# Patient Record
Sex: Male | Born: 1965 | Race: Black or African American | Hispanic: No | Marital: Single | Smoking: Never smoker
Health system: Southern US, Community
[De-identification: ages and names within clinical notes are randomized; demographics above are authoritative.]

---

## 1997-12-10 ENCOUNTER — Encounter: Admission: RE | Admit: 1997-12-10 | Discharge: 1997-12-10 | Payer: Self-pay | Admitting: Family Medicine

## 1998-01-23 ENCOUNTER — Encounter: Admission: RE | Admit: 1998-01-23 | Discharge: 1998-01-23 | Payer: Self-pay | Admitting: Family Medicine

## 2000-09-16 ENCOUNTER — Emergency Department (HOSPITAL_COMMUNITY): Admission: EM | Admit: 2000-09-16 | Discharge: 2000-09-16 | Payer: Self-pay | Admitting: Emergency Medicine

## 2000-09-16 ENCOUNTER — Encounter: Payer: Self-pay | Admitting: Emergency Medicine

## 2000-10-27 ENCOUNTER — Encounter: Payer: Self-pay | Admitting: Emergency Medicine

## 2000-10-27 ENCOUNTER — Emergency Department (HOSPITAL_COMMUNITY): Admission: EM | Admit: 2000-10-27 | Discharge: 2000-10-27 | Payer: Self-pay | Admitting: Emergency Medicine

## 2002-12-23 ENCOUNTER — Encounter: Payer: Self-pay | Admitting: Emergency Medicine

## 2002-12-23 ENCOUNTER — Emergency Department (HOSPITAL_COMMUNITY): Admission: EM | Admit: 2002-12-23 | Discharge: 2002-12-23 | Payer: Self-pay

## 2006-05-20 ENCOUNTER — Emergency Department (HOSPITAL_COMMUNITY): Admission: EM | Admit: 2006-05-20 | Discharge: 2006-05-20 | Payer: Self-pay | Admitting: Emergency Medicine

## 2011-11-12 ENCOUNTER — Ambulatory Visit: Payer: Self-pay | Admitting: Emergency Medicine

## 2011-11-12 VITALS — BP 128/80 | HR 61 | Temp 98.2°F | Resp 16 | Ht 68.5 in | Wt 170.2 lb

## 2011-11-12 DIAGNOSIS — Z0000 Encounter for general adult medical examination without abnormal findings: Secondary | ICD-10-CM

## 2011-11-12 NOTE — Progress Notes (Signed)
  Subjective:    Patient ID: Benjamin Meyers, male    DOB: [birth year], 46 y.o.   MRN: [id number]  HPI No complaints.  For physical   Review of Systems No significant medical history    Objective:   Physical Exam  Normal exam documented on form      Assessment & Plan:  Fit

## 2012-05-15 ENCOUNTER — Encounter (HOSPITAL_COMMUNITY): Payer: Self-pay | Admitting: Emergency Medicine

## 2012-05-15 ENCOUNTER — Emergency Department (HOSPITAL_COMMUNITY)
Admission: EM | Admit: 2012-05-15 | Discharge: 2012-05-15 | Disposition: A | Discharge status: Home / Self Care | Payer: Commercial Managed Care - PPO | Attending: Emergency Medicine | Admitting: Emergency Medicine

## 2012-05-15 DIAGNOSIS — R109 Unspecified abdominal pain: Secondary | ICD-10-CM | POA: Insufficient documentation

## 2012-05-15 LAB — COMPREHENSIVE METABOLIC PANEL
AST: 23 U/L (ref 0–37)
Albumin: 4.1 g/dL (ref 3.5–5.2)
Alkaline Phosphatase: 72 U/L (ref 39–117)
Chloride: 101 mEq/L (ref 96–112)
Creatinine, Ser: 1.35 mg/dL (ref 0.50–1.35)
Potassium: 4.4 mEq/L (ref 3.5–5.1)
Total Bilirubin: 0.4 mg/dL (ref 0.3–1.2)
Total Protein: 7.5 g/dL (ref 6.0–8.3)

## 2012-05-15 LAB — CBC WITH DIFFERENTIAL/PLATELET
Basophils Absolute: 0 10*3/uL (ref 0.0–0.1)
Basophils Relative: 0 % (ref 0–1)
MCHC: 33.7 g/dL (ref 30.0–36.0)
Neutro Abs: 4.8 10*3/uL (ref 1.7–7.7)
Neutrophils Relative %: 62 % (ref 43–77)
Platelets: 198 10*3/uL (ref 150–400)
RDW: 13.2 % (ref 11.5–15.5)

## 2012-05-15 LAB — URINALYSIS, MICROSCOPIC ONLY
Glucose, UA: NEGATIVE mg/dL
Ketones, ur: NEGATIVE mg/dL
Leukocytes, UA: NEGATIVE
Specific Gravity, Urine: 1.028 (ref 1.005–1.030)
pH: 6 (ref 5.0–8.0)

## 2012-05-15 NOTE — ED Notes (Signed)
Pt presenting to ed with c/o lower abdominal pain x 6 months. Pt states pain is worse when he sneezes. Pt states he thought it was a pulled muscle but it's not getting any better.

## 2012-05-15 NOTE — ED Provider Notes (Signed)
History     CSN: [id number]  Arrival date & time 05/15/12  1453   First MD Initiated Contact with Patient 05/15/12 1834      Chief Complaint  Patient presents with  . Abdominal Pain    (Consider location/radiation/quality/duration/timing/severity/associated sxs/prior treatment) Patient is a 47 y.o. male presenting with abdominal pain. The history is provided by the patient.  Abdominal Pain The primary symptoms of the illness include abdominal pain.   patient here with abdominal pain x6 months at his left mid abdomen. No fever or chills. No urinary symptoms. No vomiting or diarrhea. Pain is characterized as sharp and worse with certain movements. Denies any testicular pain. No hematuria. Pain is better with remaining still.  History reviewed. No pertinent past medical history.  History reviewed. No pertinent past surgical history.  No family history on file.  History  Substance Use Topics  . Smoking status: Never Smoker   . Smokeless tobacco: Not on file  . Alcohol Use: No      Review of Systems  Gastrointestinal: Positive for abdominal pain.  All other systems reviewed and are negative.    Allergies  Penicillins  Home Medications  No current outpatient prescriptions on file.  BP 135/72  Pulse 67  Temp 98.2 F (36.8 C) (Oral)  Resp 20  SpO2 100%  Physical Exam  Nursing note and vitals reviewed. Constitutional: He is oriented to person, place, and time. He appears well-developed and well-nourished.  Non-toxic appearance. No distress.  HENT:  Head: Normocephalic and atraumatic.  Eyes: Conjunctivae normal, EOM and lids are normal. Pupils are equal, round, and reactive to light.  Neck: Normal range of motion. Neck supple. No tracheal deviation present. No mass present.  Cardiovascular: Normal rate, regular rhythm and normal heart sounds.  Exam reveals no gallop.   No murmur heard. Pulmonary/Chest: Effort normal and breath sounds normal. No stridor. No  respiratory distress. He has no decreased breath sounds. He has no wheezes. He has no rhonchi. He has no rales.  Abdominal: Soft. Normal appearance and bowel sounds are normal. He exhibits no distension. There is no tenderness. There is no rigidity, no rebound, no guarding and no CVA tenderness.    Musculoskeletal: Normal range of motion. He exhibits no edema and no tenderness.  Neurological: He is alert and oriented to person, place, and time. He has normal strength. No cranial nerve deficit or sensory deficit. GCS eye subscore is 4. GCS verbal subscore is 5. GCS motor subscore is 6.  Skin: Skin is warm and dry. No abrasion and no rash noted.  Psychiatric: He has a normal mood and affect. His speech is normal and behavior is normal.    ED Course  Procedures (including critical care time)  Labs Reviewed  COMPREHENSIVE METABOLIC PANEL - Abnormal; Notable for the following:    Glucose, Bld 110 (*)     GFR calc non Af Amer 61 (*)     GFR calc Af Amer 71 (*)     All other components within normal limits  CBC WITH DIFFERENTIAL  LIPASE, BLOOD  URINALYSIS, MICROSCOPIC ONLY   No results found.   No diagnosis found.    MDM  Patient with normal labs and nonsurgical abdomen. Patient with rectus abdominal strain and is stable for discharge        Toy Baker, MD 05/15/12 [id number]

## 2013-02-27 ENCOUNTER — Ambulatory Visit (INDEPENDENT_AMBULATORY_CARE_PROVIDER_SITE_OTHER): Payer: BC Managed Care – PPO | Admitting: Emergency Medicine

## 2013-02-27 VITALS — BP 110/72 | HR 72 | Temp 98.3°F | Resp 16 | Ht 68.0 in | Wt 177.2 lb

## 2013-02-27 DIAGNOSIS — Z23 Encounter for immunization: Secondary | ICD-10-CM

## 2013-02-27 DIAGNOSIS — Z0000 Encounter for general adult medical examination without abnormal findings: Secondary | ICD-10-CM

## 2013-02-27 LAB — LIPID PANEL
Cholesterol: 184 mg/dL (ref 0–200)
Total CHOL/HDL Ratio: 3.5 Ratio
Triglycerides: 164 mg/dL — ABNORMAL HIGH (ref <150)
VLDL: 33 mg/dL (ref 0–40)

## 2013-02-27 LAB — POCT CBC
HCT, POC: 46.2 % (ref 43.5–53.7)
Hemoglobin: 14.4 g/dL (ref 14.1–18.1)
Lymph, poc: 2.4 (ref 0.6–3.4)
MCH, POC: 27.6 pg (ref 27–31.2)
MCHC: 31.2 g/dL — AB (ref 31.8–35.4)
MCV: 88.5 fL (ref 80–97)
POC Granulocyte: 2.3 (ref 2–6.9)
POC LYMPH PERCENT: 47.7 %L (ref 10–50)
RDW, POC: 15 %
WBC: 5.1 10*3/uL (ref 4.6–10.2)

## 2013-02-27 LAB — COMPREHENSIVE METABOLIC PANEL
BUN: 19 mg/dL (ref 6–23)
CO2: 29 mEq/L (ref 19–32)
Calcium: 10 mg/dL (ref 8.4–10.5)
Chloride: 106 mEq/L (ref 96–112)
Creat: 1.67 mg/dL — ABNORMAL HIGH (ref 0.50–1.35)
Glucose, Bld: 104 mg/dL — ABNORMAL HIGH (ref 70–99)
Total Bilirubin: 0.6 mg/dL (ref 0.3–1.2)

## 2013-02-27 LAB — POCT URINALYSIS DIPSTICK
Bilirubin, UA: NEGATIVE
Blood, UA: NEGATIVE
Ketones, UA: NEGATIVE
Protein, UA: NEGATIVE
Spec Grav, UA: 1.015
pH, UA: 7

## 2013-02-27 NOTE — Progress Notes (Signed)
[facility] [address] [place] [address] [phone]  Date:  02/27/2013   Name:  Benjamin Meyers   DOB:  June 28, 1965   MRN:  [id number]  PCP:  No primary provider on file.    Chief Complaint: Annual Exam   History of Present Illness:  Benjamin Meyers is a 47 y.o. very pleasant male patient who presents with the following:  For wellness physical.  No medications.  No past medical history of consequence.  Truck driver.  No improvement with over the counter medications or other home remedies. Denies other complaint or health concern today.   There are no active problems to display for this patient.   No past medical history on file.  No past surgical history on file.  History  Substance Use Topics  . Smoking status: Never Smoker   . Smokeless tobacco: Not on file  . Alcohol Use: No    Family History  Problem Relation Age of Onset  . Hepatitis Mother   . Arthritis Sister   . Cancer Paternal Grandmother     Allergies  Allergen Reactions  . Penicillins Hives    Medication list has been reviewed and updated.  No current outpatient prescriptions on file prior to visit.   No current facility-administered medications on file prior to visit.    Review of Systems:  As per HPI, otherwise negative.    Physical Examination: Filed Vitals:   02/27/13 1032  BP: 110/72  Pulse: 72  Temp: 98.3 F (36.8 C)  Resp: 16   Filed Vitals:   02/27/13 1032  Height: 5\' 8"  (1.727 m)  Weight: 177 lb 3.2 oz (80.377 kg)   Body mass index is 26.95 kg/(m^2). Ideal Body Weight: Weight in (lb) to have BMI = 25: 164.1  GEN: WDWN, NAD, Non-toxic, A & O x 3 HEENT: Atraumatic, Normocephalic. Neck supple. No masses, No LAD. Ears and Nose: No external deformity. CV: RRR, No M/G/R. No JVD. No thrill. No extra heart sounds. PULM: CTA B, no wheezes, crackles, rhonchi. No retractions. No resp. distress. No accessory muscle use. ABD: S, NT, ND, +BS. No rebound. No  HSM. EXTR: No c/c/e NEURO Normal gait.  PSYCH: Normally interactive. Conversant. Not depressed or anxious appearing.  Calm demeanor.  DRE:  Grossly negative  Assessment and Plan: Wellness examination Follow up in one year   Signed,  Phillips Odor, MD

## 2013-03-01 LAB — VITAMIN D 25 HYDROXY (VIT D DEFICIENCY, FRACTURES): Vit D, 25-Hydroxy: 49 ng/mL (ref 30–89)

## 2018-01-16 ENCOUNTER — Ambulatory Visit: Payer: Self-pay | Admitting: Emergency Medicine

## 2018-02-04 ENCOUNTER — Emergency Department (HOSPITAL_COMMUNITY): Payer: BLUE CROSS/BLUE SHIELD

## 2018-02-04 ENCOUNTER — Encounter (HOSPITAL_COMMUNITY): Payer: Self-pay | Admitting: Emergency Medicine

## 2018-02-04 ENCOUNTER — Emergency Department (HOSPITAL_COMMUNITY)
Admission: EM | Admit: 2018-02-04 | Discharge: 2018-02-04 | Disposition: A | Discharge status: Home / Self Care | Payer: BLUE CROSS/BLUE SHIELD | Attending: Emergency Medicine | Admitting: Emergency Medicine

## 2018-02-04 ENCOUNTER — Other Ambulatory Visit: Payer: Self-pay

## 2018-02-04 DIAGNOSIS — Z79899 Other long term (current) drug therapy: Secondary | ICD-10-CM | POA: Diagnosis not present

## 2018-02-04 DIAGNOSIS — M25532 Pain in left wrist: Secondary | ICD-10-CM | POA: Insufficient documentation

## 2018-02-04 NOTE — Discharge Instructions (Signed)
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  No evidence of fracture.  Your exam today was suggestive of de Quervain's tenosynovitis.  Avoid heavy lifting and repetitive motion of the wrist.  1. Medications: Alternate 600 mg of ibuprofen and [phone] mg of Tylenol every 3 hours as needed for pain. Do not exceed 4000 mg of Tylenol daily.  Take ibuprofen with food to avoid upset stomach issues.  2. Treatment: rest, ice, elevate and use brace, drink plenty of fluids, gentle stretching 3. Follow Up: Please followup with orthopedics as directed or your PCP in 1 week if no improvement for discussion of your diagnoses and further evaluation after today's visit; Please return to the ER for worsening symptoms or other concerns such as worsening swelling, redness of the skin, fevers, loss of pulses, or loss of feeling

## 2018-02-04 NOTE — ED Notes (Signed)
Patient transported to X-ray 

## 2018-02-04 NOTE — ED Notes (Signed)
ED Provider at bedside. 

## 2018-02-04 NOTE — ED Provider Notes (Signed)
[facility]   CSN: [id number] Arrival date & time: 02/04/18  7425     History   Chief Complaint Chief Complaint  Patient presents with  . Wrist Pain    HPI Benjamin Meyers is a 52 y.o. male with no significant past medical history presents today for evaluation of acute onset, ongoing left wrist pain for 2 to 3 months.  He is right-hand dominant.  He notes pain to the left distal radius and CMC joint which is sharp in nature and occurs with movement of the thumb and flexion of the wrist.  Denies numbness or tingling.  Pain does not radiate.  Has been using a splint with improvement.  Denies any recent injuries.  No fevers.  Has not been taking any medications.  The history is provided by the patient.    History reviewed. No pertinent past medical history.  There are no active problems to display for this patient.   History reviewed. No pertinent surgical history.      Home Medications    Prior to Admission medications   Medication Sig Start Date End Date Taking? Authorizing Provider  Multiple Vitamins-Minerals (ONE DAILY MULTIVITAMIN MEN PO) Take 1 tablet by mouth daily.   Yes [provider]    Family History Family History  Problem Relation Age of Onset  . Hepatitis Mother   . Arthritis Sister   . Cancer Paternal Grandmother     Social History Social History   Tobacco Use  . Smoking status: Never Smoker  . Smokeless tobacco: Never Used  Substance Use Topics  . Alcohol use: No  . Drug use: No     Allergies   Penicillins   Review of Systems Review of Systems  Constitutional: Negative for chills and fever.  Musculoskeletal: Positive for arthralgias.  Neurological: Negative for weakness and numbness.     Physical Exam Updated Vital Signs BP 133/83 (BP Location: Left Arm)   Pulse (!) 56   Temp 97.6 F (36.4 C) (Oral)   Resp 16   SpO2 100%   Physical Exam  Constitutional: He appears  well-developed and well-nourished. No distress.  HENT:  Head: Normocephalic and atraumatic.  Eyes: Conjunctivae are normal. Right eye exhibits no discharge. Left eye exhibits no discharge.  Neck: No JVD present. No tracheal deviation present.  Cardiovascular: Normal rate and intact distal pulses.  2+ radial pulses bilaterally  Pulmonary/Chest: Effort normal.  Abdominal: He exhibits no distension.  Musculoskeletal: Normal range of motion. He exhibits tenderness. He exhibits no edema.  Tenderness to palpation overlying the left first CMC joint.  Finkelstein sign present.  Normal active range of motion of the wrist and digits bilaterally.  5/5 strength of BUE major muscle groups.  No crepitus or deformity.  No snuffbox tenderness.  Neurological: He is alert.  Fluent speech, no facial droop, sensation intact to soft touch of extremities.  Equal grip strength bilaterally.  Skin: Skin is warm and dry. No erythema.  Psychiatric: He has a normal mood and affect. His behavior is normal.  Nursing note and vitals reviewed.    ED Treatments / Results  Labs (all labs ordered are listed, but only abnormal results are displayed) Labs Reviewed - No data to display  EKG None  Radiology Dg Wrist Complete Left  Result Date: 02/04/2018 CLINICAL DATA:  Left wrist pain that began after lifting weights in the past. No discrete injury. EXAM: LEFT WRIST - COMPLETE 3+ VIEW COMPARISON:  None. FINDINGS:  Small oval calcification distal to the ulna adjacent to the ulnar styloid. Mild spur formation at the distal radioulnar joint. Small spur arising from the radial styloid. No acute fracture or dislocation seen. IMPRESSION: Mild degenerative changes, as described above. Electronically Signed   By: Beckie Salts M.D.   On: 02/04/2018 10:45    Procedures Procedures (including critical care time)  Medications Ordered in ED Medications - No data to display   Initial Impression / Assessment and Plan / ED Course    I have reviewed the triage vital signs and the nursing notes.  Pertinent labs & imaging results that were available during my care of the patient were reviewed by me and considered in my medical decision making (see chart for details).     Patient with left wrist pain for 4 months intermittently.  He is afebrile, vital signs are stable.  He is nontoxic in appearance.  He is neurovascularly intact.  No evidence of secondary skin infection.  Radiographs show degenerative changes but no acute abnormality.  History and physical examination suggestive of de Quervain's tenosynovitis.  RICE therapy indicated and discussed.  He has a thumb spica splint at bedside.  Recommend follow-up with orthopedic hand surgery for reevaluation and discussed the possibility of missed fracture diagnosis.  Discussed strict ED return precautions. Pt verbalized understanding of and agreement with plan and is safe for discharge home at this time.   Final Clinical Impressions(s) / ED Diagnoses   Final diagnoses:  Left wrist pain    ED Discharge Orders    None       Jeanie Sewer, PA-C 02/04/18 1103    Alvira Monday, MD 02/04/18 2317

## 2018-02-04 NOTE — ED Triage Notes (Addendum)
Pt c/o left wrist pain for severalmonths. Pt been wearing a brace for months as well. Denies any new falls or injuries

## 2019-10-17 IMAGING — CR DG WRIST COMPLETE 3+V*L*
4 series · 4 of 4 positions shown · non-contrast
Comparison: None.

CLINICAL DATA: Left wrist pain that began after lifting weights in
the past. No discrete injury.

EXAM:
LEFT WRIST - COMPLETE 3+ VIEW

[x wrist pa left]
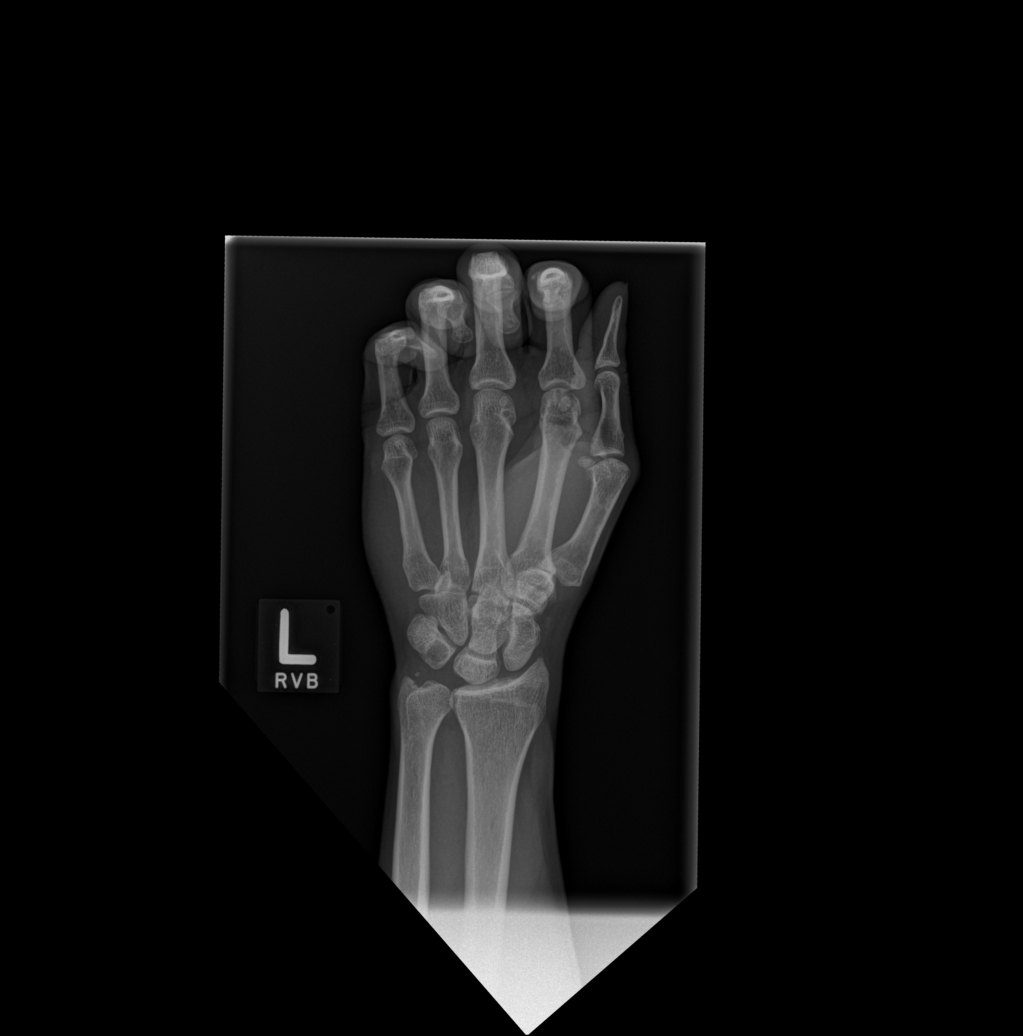

[x wrist obl left]
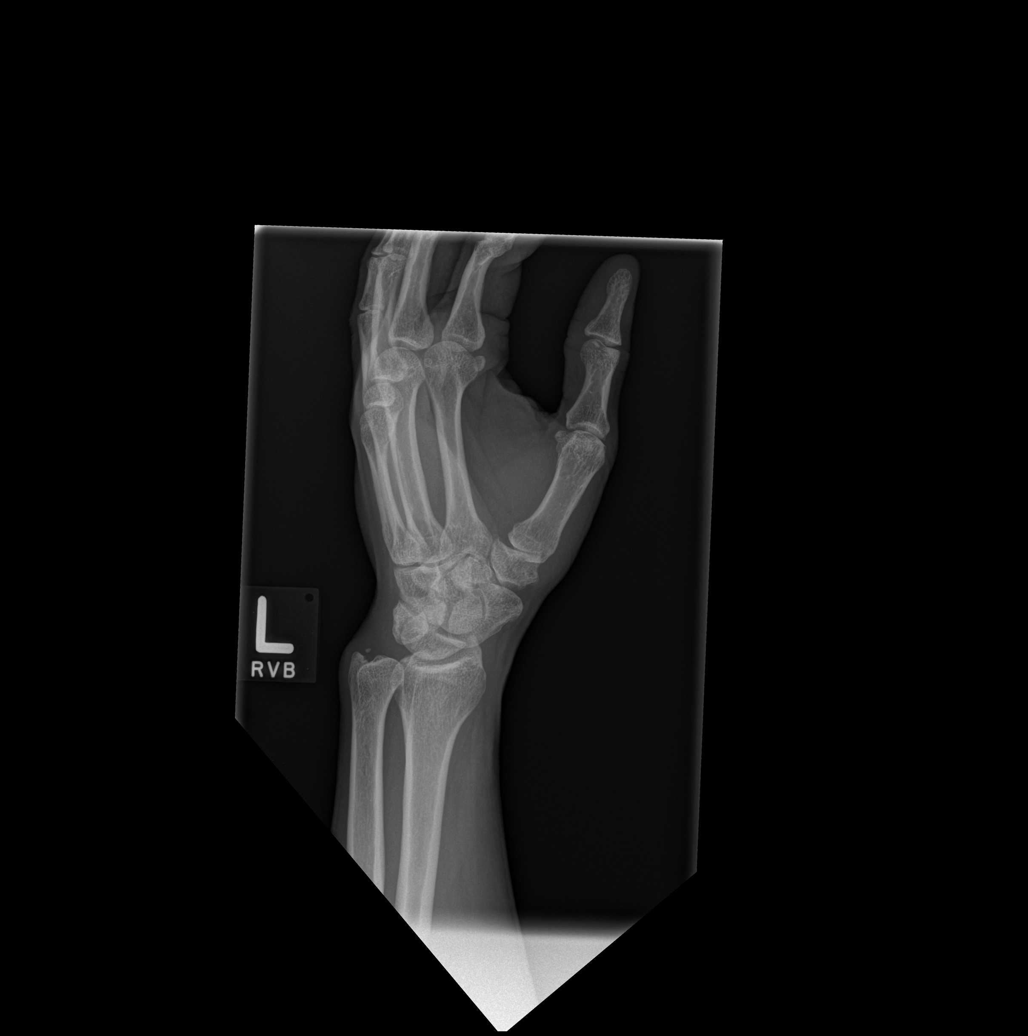

[x wrist lat left]
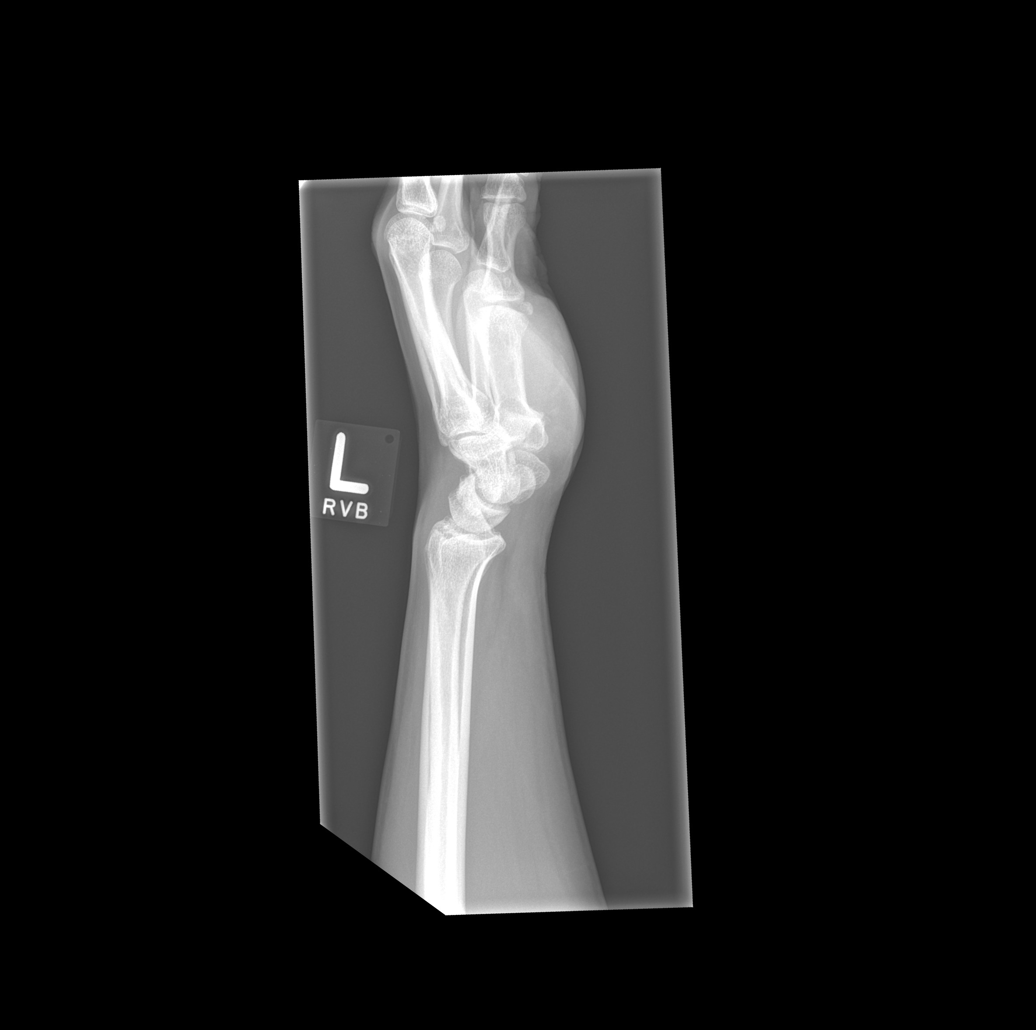

[x wrist navicular view left]
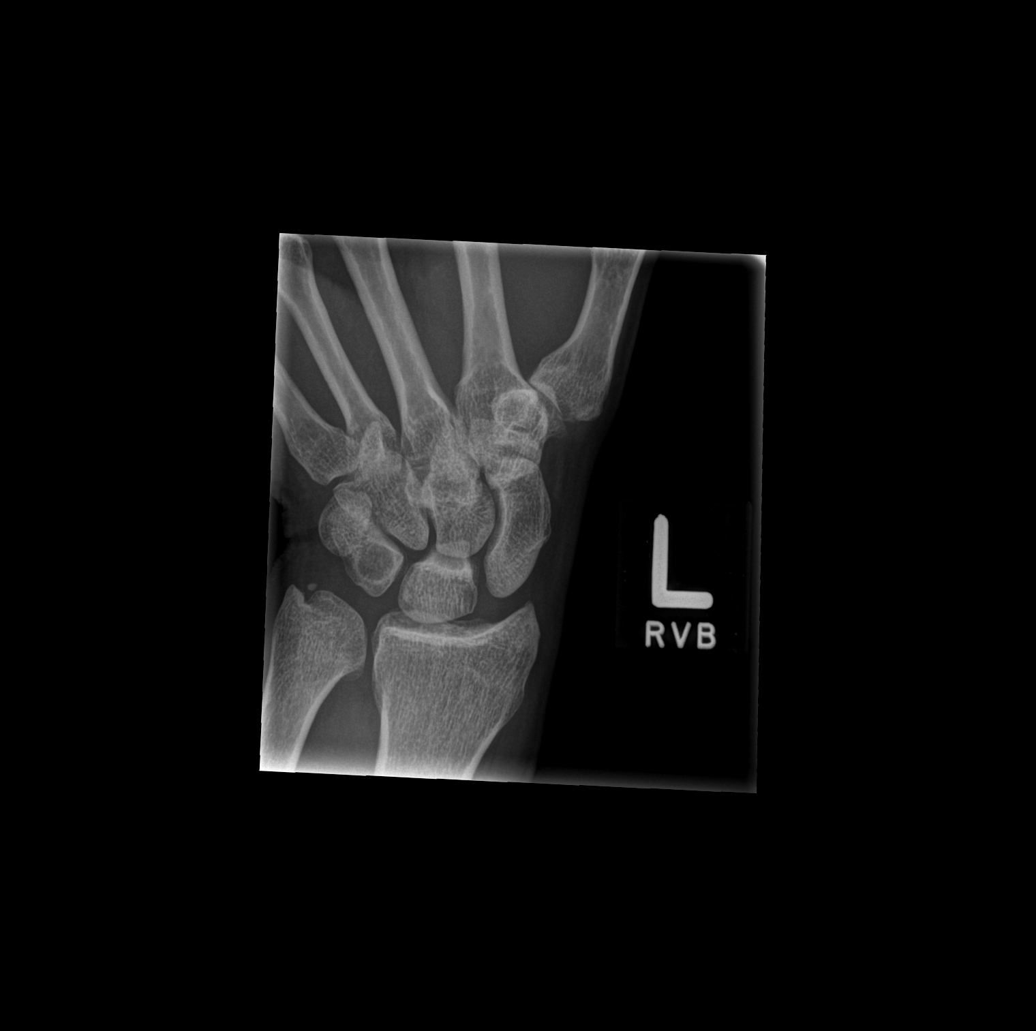

[4 of 4 positions shown; findings below may reference images not displayed]

FINDINGS: Small oval calcification distal to the ulna adjacent to the ulnar
styloid. Mild spur formation at the distal radioulnar joint. Small
spur arising from the radial styloid. No acute fracture or
dislocation seen.
IMPRESSION: Mild degenerative changes, as described above.
